# Patient Record
Sex: Male | Born: 1980 | Race: White | Hispanic: No | Marital: Married | Smoking: Never smoker
Health system: Southern US, Community
[De-identification: ages and names within clinical notes are randomized; demographics above are authoritative.]

## PROBLEM LIST (undated history)

## (undated) DIAGNOSIS — N200 Calculus of kidney: Secondary | ICD-10-CM

---

## 2014-05-21 ENCOUNTER — Emergency Department (HOSPITAL_BASED_OUTPATIENT_CLINIC_OR_DEPARTMENT_OTHER): Payer: Self-pay

## 2014-05-21 ENCOUNTER — Emergency Department (HOSPITAL_BASED_OUTPATIENT_CLINIC_OR_DEPARTMENT_OTHER)
Admission: EM | Admit: 2014-05-21 | Discharge: 2014-05-21 | Disposition: A | Discharge status: Home / Self Care | Payer: Self-pay | Attending: Emergency Medicine | Admitting: Emergency Medicine

## 2014-05-21 ENCOUNTER — Encounter (HOSPITAL_BASED_OUTPATIENT_CLINIC_OR_DEPARTMENT_OTHER): Payer: Self-pay | Admitting: Emergency Medicine

## 2014-05-21 DIAGNOSIS — Z87442 Personal history of urinary calculi: Secondary | ICD-10-CM | POA: Insufficient documentation

## 2014-05-21 DIAGNOSIS — R52 Pain, unspecified: Secondary | ICD-10-CM | POA: Insufficient documentation

## 2014-05-21 DIAGNOSIS — R109 Unspecified abdominal pain: Secondary | ICD-10-CM | POA: Insufficient documentation

## 2014-05-21 HISTORY — DX: Calculus of kidney: N20.0

## 2014-05-21 LAB — URINALYSIS, ROUTINE W REFLEX MICROSCOPIC
Bilirubin Urine: NEGATIVE
Glucose, UA: NEGATIVE mg/dL
HGB URINE DIPSTICK: NEGATIVE
Ketones, ur: NEGATIVE mg/dL
Leukocytes, UA: NEGATIVE
NITRITE: NEGATIVE
PROTEIN: NEGATIVE mg/dL
SPECIFIC GRAVITY, URINE: 1.007 (ref 1.005–1.030)
UROBILINOGEN UA: 0.2 mg/dL (ref 0.0–1.0)
pH: 5.5 (ref 5.0–8.0)

## 2014-05-21 MED ORDER — HYDROCODONE-ACETAMINOPHEN 5-325 MG PO TABS: 2.0000 | ORAL_TABLET | ORAL | Status: AC | PRN

## 2014-05-21 MED ORDER — TAMSULOSIN HCL 0.4 MG PO CAPS: 0.4000 mg | ORAL_CAPSULE | Freq: Every day | ORAL | Status: AC

## 2014-05-21 MED ORDER — KETOROLAC TROMETHAMINE 60 MG/2ML IM SOLN
60.0000 mg | Freq: Once | INTRAMUSCULAR | Status: AC
  Administered 2014-05-21: 60 mg via INTRAMUSCULAR
  Filled 2014-05-21: qty 2

## 2014-05-21 NOTE — ED Notes (Signed)
Pt c/o right flank pain x 8 hrs HX kidney stones

## 2014-05-21 NOTE — ED Provider Notes (Signed)
CSN: [id number]     Arrival date & time 05/21/14  1548 History   First MD Initiated Contact with Patient 05/21/14 1652     Chief Complaint  Patient presents with  . Flank Pain     (Consider location/radiation/quality/duration/timing/severity/associated sxs/prior Treatment) Patient is a 33 y.o. male presenting with flank pain. The history is provided by the patient and a relative. The history is limited by a language barrier.  Flank Pain This is a new problem. The current episode started today. The problem occurs constantly. The problem has been unchanged. Associated symptoms include abdominal pain. Pertinent negatives include no fever or urinary symptoms. Nothing aggravates the symptoms. He has tried nothing for the symptoms. The treatment provided moderate relief.   Pt reports he has had pain in right back and right groin today.   Pain comes and goes.   Pt has had a kidney stone in the past and it feels the same.   (Pt is requesting no ct due to cost)    Past Medical History  Diagnosis Date  . Kidney stone    History reviewed. No pertinent past surgical history. History reviewed. No pertinent family history. History  Substance Use Topics  . Smoking status: Never Smoker   . Smokeless tobacco: Not on file  . Alcohol Use: No    Review of Systems  Constitutional: Negative for fever.  Gastrointestinal: Positive for abdominal pain.  Genitourinary: Positive for flank pain.  All other systems reviewed and are negative.     Allergies  Review of patient's allergies indicates no known allergies.  Home Medications   Prior to Admission medications   Not on File   BP 121/84  Pulse 68  Temp(Src) 98.2 F (36.8 C) (Oral)  Resp 16  SpO2 99% Physical Exam  Nursing note and vitals reviewed. Constitutional: He is oriented to person, place, and time. He appears well-developed and well-nourished.  HENT:  Head: Normocephalic and atraumatic.  Eyes: Conjunctivae and EOM are normal.   Neck: Normal range of motion.  Cardiovascular: Normal rate and regular rhythm.   Pulmonary/Chest: Effort normal.  Abdominal: Soft. He exhibits no distension. There is no tenderness. There is no rebound.  Musculoskeletal: Normal range of motion.  Neurological: He is alert and oriented to person, place, and time.  Skin: Skin is warm.  Psychiatric: He has a normal mood and affect.    ED Course  Procedures (including critical care time) Labs Review Labs Reviewed  URINALYSIS, ROUTINE W REFLEX MICROSCOPIC    Imaging Review No results found.   EKG Interpretation None      MDM I doubt appendicitis,  Doubt torsion,   Pt reports soreness to back currently, no abdominal pain.  No testicular pain.   Pt understands need to return if symptoms worsen or changes.   Pt understands he may need to return for Ct if pain returns worsens.     Final diagnoses:  Acute right flank pain    Pt given injection of torodol.  ua negative,  KUb no obv stone.   I will treat with flomax and hydrocodone.   Pt advised to return if pain worsen.   Pt advised he may require ct scan.    Elson AreasLeslie K Chloe Bluett, PA-C 05/21/14 1816  Elson AreasLeslie K Skyelar Halliday, PA-C 05/22/14 0001

## 2014-05-21 NOTE — Discharge Instructions (Signed)

## 2014-05-23 NOTE — ED Provider Notes (Signed)
Medical screening examination/treatment/procedure(s) were performed by non-physician practitioner and as supervising physician I was immediately available for consultation/collaboration.   EKG Interpretation None        Akio Hudnall David Emiah Pellicano III, MD 05/23/14 1435 

## 2015-09-03 IMAGING — CR DG ABDOMEN 1V
2 series · 2 of 2 positions shown · non-contrast
Comparison: None.

CLINICAL DATA: 33-year-old male with right-sided groin and pelvic
pain.

EXAM:
ABDOMEN - 1 VIEW

[t abdomen supine (1 of 2)]
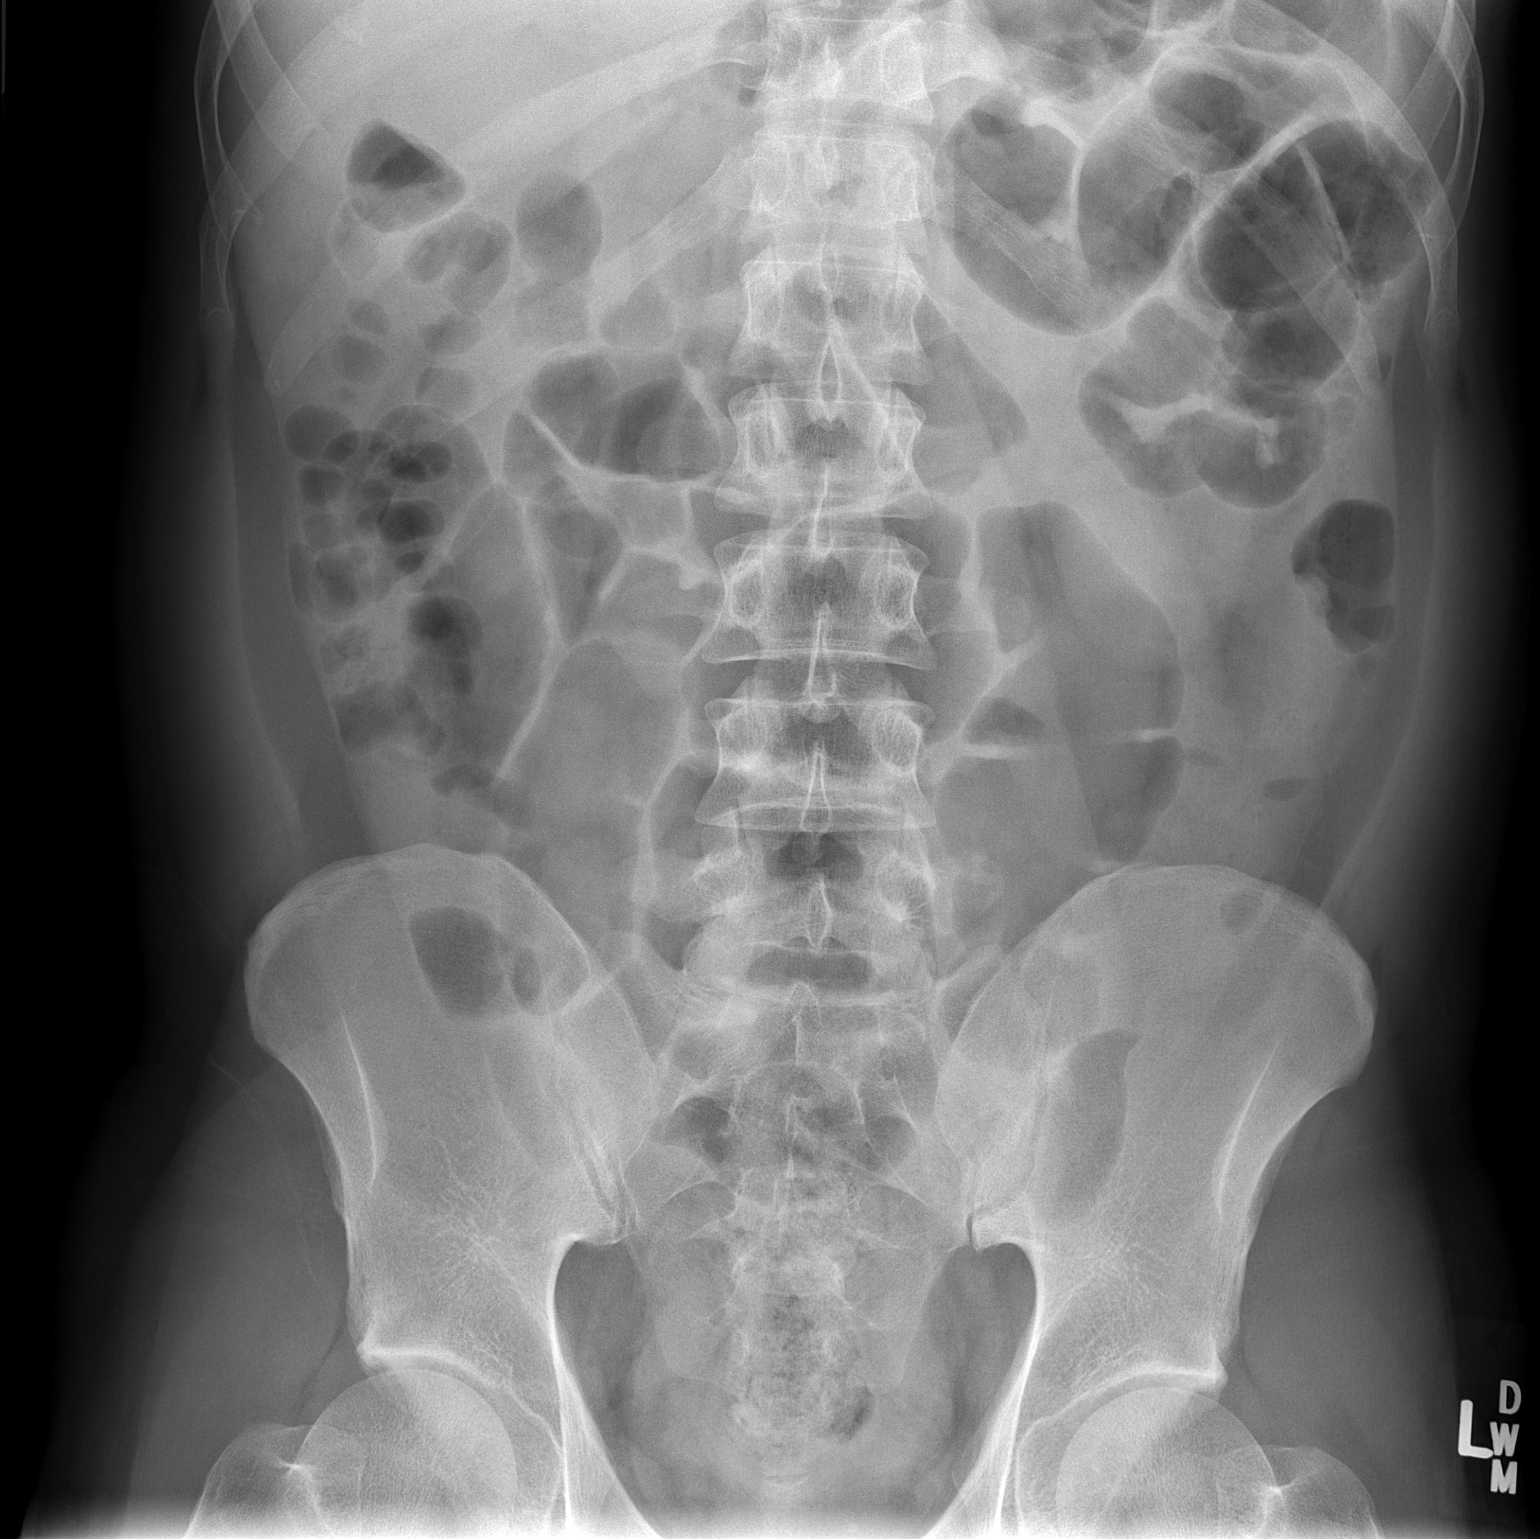

[t abdomen supine (2 of 2)]
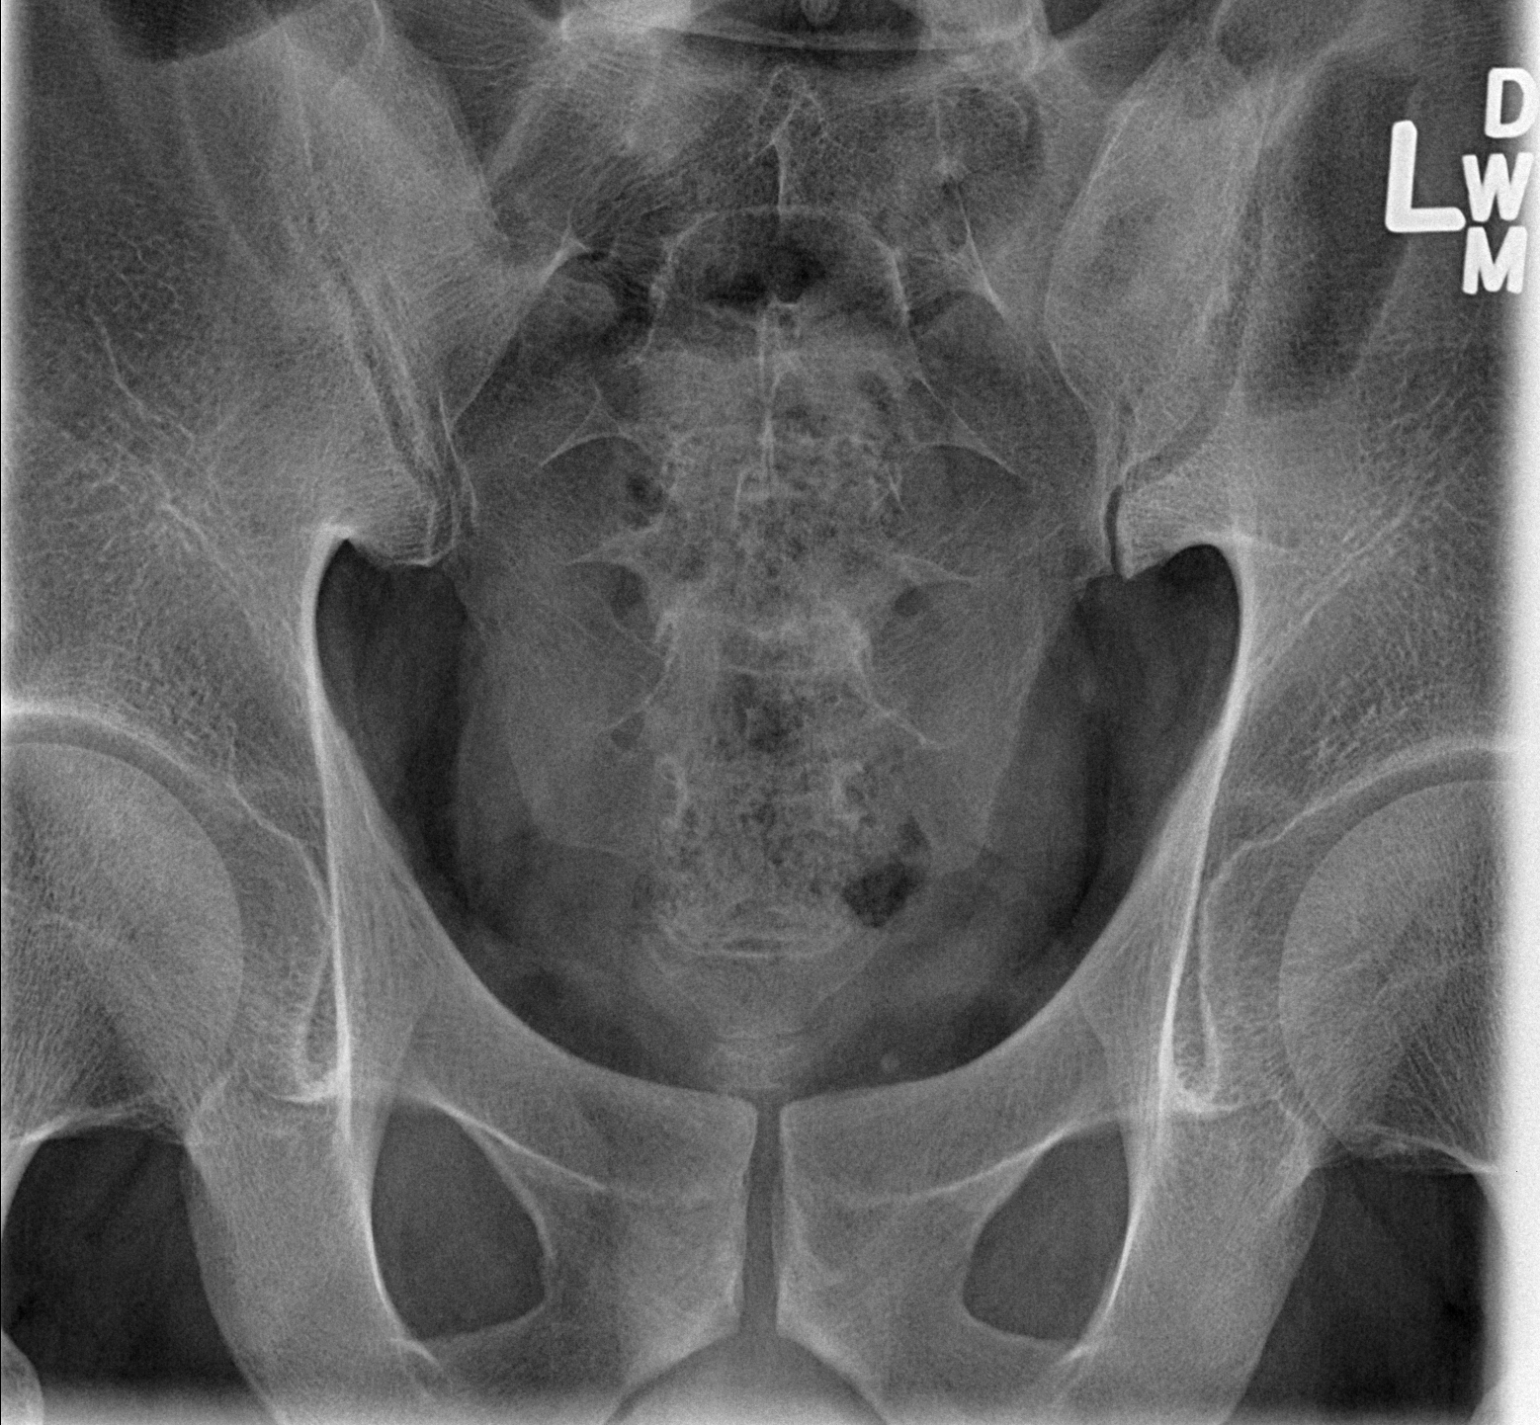

[2 of 2 positions shown; findings below may reference images not displayed]

FINDINGS: Nondistended gas-filled loops of small bowel and colon noted.

No suspicious calcifications identified.

The bony structures are unremarkable.
IMPRESSION: Nonspecific nonobstructive bowel gas pattern.

No calcifications identified overlying the expected courses of the
ureters or renal shadows.
